# Patient Record
Sex: Female | Born: 2000 | Race: Black or African American | Hispanic: No | Marital: Single | Smoking: Never smoker
Health system: Southern US, Community
[De-identification: ages and names within clinical notes are randomized; demographics above are authoritative.]

## PROBLEM LIST (undated history)

## (undated) DIAGNOSIS — J45909 Unspecified asthma, uncomplicated: Secondary | ICD-10-CM

---
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
## 2022-01-04 ENCOUNTER — Ambulatory Visit: Payer: Medicaid Other | Admitting: Physical Therapy

## 2022-01-04 ENCOUNTER — Ambulatory Visit (INDEPENDENT_AMBULATORY_CARE_PROVIDER_SITE_OTHER): Payer: Medicaid Other | Admitting: Internal Medicine

## 2022-01-04 ENCOUNTER — Encounter: Payer: Self-pay | Admitting: Internal Medicine

## 2022-01-04 VITALS — BP 126/68 | HR 56 | Temp 98.0°F | Resp 16 | Ht 66.0 in | Wt 263.1 lb

## 2022-01-04 DIAGNOSIS — M25562 Pain in left knee: Secondary | ICD-10-CM | POA: Diagnosis not present

## 2022-01-04 DIAGNOSIS — M25462 Effusion, left knee: Secondary | ICD-10-CM

## 2022-01-04 NOTE — Progress Notes (Signed)
Established Patient Office Visit  Subjective:  Patient ID: Savannah Holt, female    DOB: 2001-11-05  Age: 21 y.o. MRN: [id number]  CC:  Chief Complaint  Patient presents with   Follow-up   Knee Pain    Bilateral,  having pain, hard to walk, swelling, Dr. Ancil Boozer sent her to PT but it has been going on for a month and she needs to know whats going on    HPI Savannah Holt presents for follow up on bilateral knee swelling. Was seen about 3 weeks ago for same issue ans was started on mobic 15 mg as needed for pain. Pain started in 1.5 months ago, started in right knee and then in left knee. Both swollen. Mainly left knee currently. Denies initial trauma but does stand and walk frequently at job.  KNEE PAIN Duration: 6 weeks Involved knee:  first right knee, now left Mechanism of injury: unknown, no known trauma Location:diffuse Onset: sudden Severity:  at rest mild but with getting more moderate with standing up and down   Quality:  aching Frequency: intermittent Radiation: no Aggravating factors: weight bearing and walking  Alleviating factors: NSAIDs and rest  Went to physical therapy once last week, not sure if it helped Status: worse Treatments attempted: rest, ibuprofen, and physical therapy  Relief with NSAIDs?:  mild Weakness with weight bearing or walking: no Sensation of giving way: no Locking: no Popping: no Bruising: no Swelling: yes Redness: no Paresthesias/decreased sensation: no Fevers: no No other joints bothering her, no urinary symptoms, not sexually active. No family history of inflammatory or auto-immune disorders that she knows of.   Past Medical History:  Diagnosis Date   Asthma     No past surgical history on file.  Family History  Problem Relation Age of Onset   Heart failure Mother    Seizures Father    Asthma Sister     Social History   Socioeconomic History   Marital status: Single    Spouse name: Not on file   Number of  children: Not on file   Years of education: Not on file   Highest education level: Not on file  Occupational History   Not on file  Tobacco Use   Smoking status: Never   Smokeless tobacco: Never  Vaping Use   Vaping Use: Never used  Substance and Sexual Activity   Alcohol use: Never   Drug use: Never   Sexual activity: Never  Other Topics Concern   Not on file  Social History Narrative   Not on file   Social Determinants of Health   Financial Resource Strain: Not on file  Food Insecurity: Not on file  Transportation Needs: Not on file  Physical Activity: Not on file  Stress: Not on file  Social Connections: Not on file  Intimate Partner Violence: Not on file    Outpatient Medications Prior to Visit  Medication Sig Dispense Refill   albuterol (VENTOLIN HFA) 108 (90 Base) MCG/ACT inhaler Inhale 2 puffs into the lungs every 6 (six) hours as needed for wheezing or shortness of breath. 18 g 1   cetirizine (ZYRTEC ALLERGY) 10 MG tablet Take 1 tablet (10 mg total) by mouth daily. 30 tablet 2   meloxicam (MOBIC) 15 MG tablet Take 1 tablet (15 mg total) by mouth daily. With food 30 tablet 0   montelukast (SINGULAIR) 10 MG tablet Take 1 tablet (10 mg total) by mouth at bedtime. 30 tablet 5   No facility-administered  medications prior to visit.    No Known Allergies  ROS Review of Systems  Constitutional:  Negative for chills and fever.  Genitourinary:  Negative for dysuria and hematuria.  Musculoskeletal:  Positive for arthralgias and joint swelling.  Skin: Negative.      Objective:    Physical Exam Constitutional:      Appearance: Normal appearance.  HENT:     Head: Normocephalic and atraumatic.  Eyes:     Conjunctiva/sclera: Conjunctivae normal.  Cardiovascular:     Rate and Rhythm: Normal rate and regular rhythm.  Pulmonary:     Effort: Pulmonary effort is normal.     Breath sounds: Normal breath sounds.  Musculoskeletal:        General: Swelling present. No  tenderness. Normal range of motion.     Right knee: Normal. No swelling, erythema, ecchymosis, bony tenderness or crepitus. Normal range of motion. No tenderness. No LCL laxity, MCL laxity, ACL laxity or PCL laxity. Normal alignment, normal meniscus and normal patellar mobility.     Instability Tests: Anterior drawer test negative. Posterior drawer test negative. Anterior Lachman test negative. Medial McMurray test negative and lateral McMurray test negative.     Left knee: Swelling present. No erythema, ecchymosis, bony tenderness or crepitus. Normal range of motion. No tenderness. No LCL laxity, MCL laxity, ACL laxity or PCL laxity.Normal alignment, normal meniscus and normal patellar mobility.     Instability Tests: Anterior drawer test negative. Posterior drawer test negative. Anterior Lachman test negative. Medial McMurray test negative and lateral McMurray test negative.     Right lower leg: No edema.     Left lower leg: No edema.  Skin:    General: Skin is warm and dry.  Neurological:     General: No focal deficit present.     Mental Status: She is alert. Mental status is at baseline.  Psychiatric:        Behavior: Behavior normal.     Comments: Tearful.    BP 126/68    Pulse (!) 56    Temp 98 F (36.7 C)    Resp 16    Ht _0  (1.676 m)    Wt 263 lb 1.6 oz (119.3 kg)    LMP 12/02/2021    SpO2 99%    BMI 42.47 kg/m  Wt Readings from Last 3 Encounters:  12/21/21 260 lb (117.9 kg) (>99 %, Z= 2.54)*  09/11/20 (!) 266 lb (120.7 kg) (>99 %, Z= 2.55)*  12/24/19 297 lb 12.8 oz (135.1 kg) (>99 %, Z= 2.74)*   * Growth percentiles are based on CDC (Girls, 2-20 Years) data.     Health Maintenance Due  Topic Date Due   HPV VACCINES (1 - 2-dose series) Never done   HIV Screening  Never done   INFLUENZA VACCINE  Never done       Topic Date Due   HPV VACCINES (1 - 2-dose series) Never done    No results found for: TSH Lab Results  Component Value Date   WBC 4.4 (L) 12/21/2021    HGB 13.7 12/21/2021   HCT 42.1 12/21/2021   MCV 81.3 12/21/2021   PLT 247 12/21/2021   Lab Results  Component Value Date   NA 139 12/21/2021   K 4.5 12/21/2021   CO2 25 12/21/2021   GLUCOSE 82 12/21/2021   BUN 10 12/21/2021   CREATININE 0.72 12/21/2021   BILITOT 0.3 12/21/2021   ALKPHOS 72 03/24/2019   AST 15 12/21/2021   ALT  22 12/21/2021   PROT 6.9 12/21/2021   ALBUMIN 4.1 03/24/2019   CALCIUM 9.3 12/21/2021   ANIONGAP 9 03/24/2019   EGFR 124 12/21/2021   Lab Results  Component Value Date   CHOL 151 12/21/2021   Lab Results  Component Value Date   HDL 52 12/21/2021   Lab Results  Component Value Date   LDLCALC 85 12/21/2021   Lab Results  Component Value Date   TRIG 55 12/21/2021   Lab Results  Component Value Date   CHOLHDL 2.9 12/21/2021   Lab Results  Component Value Date   HGBA1C 5.2 12/21/2021      Assessment & Plan:   1. Pain and swelling of left knee: Physical exam with normal ROM, no pain to palpation but mild medial swelling on the left. Has tried Mobic daily for 1 week and 1 session of physical therapy. Very frustrated about why knee still swollen, concerned about her job. An x-ray will be ordered to assess effusion. Blood work to assess for inflammation ordered as well. Not likely infectious as she is not sexually active. No history of trauma. Discussed rest, elevation ice and anti-inflammatories as needed to reduce swelling. Work note given. Follow up in 2 weeks  - Sed Rate (ESR) - C-reactive protein - Antinuclear Antib (ANA) - DG Knee 3 Views Left; Future   Follow-up: Return in about 2 weeks (around 01/18/2022).    Teodora Medici, DO

## 2022-01-04 NOTE — Patient Instructions (Signed)
It was great seeing you today!  Plan discussed at today's visit: -Blood work ordered today, results will be uploaded to [facility].  -Can use rest, ice, brace, elevation and Mobic (take with food, do NOT take any other anti-inflammatories) -Can use Voltaren  -X-ray of left knee today  Follow up in: 2 weeks  Take care and let us know if you have any questions or concerns prior to your next visit.  Dr. Rosana Berger  Knee Effusion Knee effusion refers to excess fluid in the knee joint. This can cause pain and swelling in your knee. Knee effusion creates more pressure than usual in your knee joint. This makes it more difficult for you to bend and move your knee. If there is fluid in your knee, it often means that something is wrong inside your knee. This can be a result of: Severe arthritis. Injury to the knee muscles or to tissues in the knee (ligaments or cartilage). Infection. Autoimmune disease. This means that your body's defense system (immune system) mistakenly attacks healthy body tissues. Follow these instructions at home: If you have a brace or an immobilizer: Wear it as told by your health care provider. Remove it only as told by your health care provider. Check the skin around it every day. Tell your health care provider about any concerns. Loosen it if your toes tingle, become numb, or turn cold and blue. Keep it clean. If the brace or immobilizer is not waterproof: Do not let it get wet. Cover it with a watertight covering when you take a bath or shower. Managing pain, stiffness, and swelling  If directed, put ice on the affected area. To do this: If you have a removable brace or immobilizer, remove it as told by your health care provider. Put ice in a plastic bag. Place a towel between your skin and the bag. Leave the ice on for 20 minutes, 2-3 times a day. Remove the ice if your skin turns bright red. This is very important. If you cannot feel pain, heat, or cold, you have a  greater risk of damage to the area. Move your toes often to reduce stiffness and swelling. Raise (elevate) your knee above the level of your heart while you are sitting or lying down. Activity Rest as told by your health care provider. Do not use the injured limb to support your body weight until your health care provider says that you can. Use crutches as told by your health care provider. Do exercises as told by your health care provider. General instructions Take over-the-counter and prescription medicines only as told by your health care provider. Do not use any products that contain nicotine or tobacco. These products include cigarettes, chewing tobacco, and vaping devices, such as e-cigarettes. If you need help quitting, ask your health care provider. Wear an elastic bandage or a wrap that puts pressure on your knee (compression wrap) as told by your health care provider. Keep all follow-up visits. This is important. Contact a health care provider if: You continue to have pain in your knee. You have a fever or chills. Get help right away if: You have swelling or redness in your knee that gets worse or does not get better. You have severe pain in your knee. Summary Knee effusion refers to excess fluid in the knee joint. This causes pain and swelling and makes it difficult to bend and move your knee. Effusion may be caused by severe arthritis, autoimmune disease, infection, or injury to the knee muscles  or to tissues in the knee (ligaments or cartilage). Take over-the-counter and prescription medicines only as told by your health care provider. If you have a brace or an immobilizer, wear it as told by your health care provider. This information is not intended to replace advice given to you by your health care provider. Make sure you discuss any questions you have with your health care provider. Document Revised: 08/12/2020 Document Reviewed: 08/12/2020 Elsevier Patient Education  2022  [employer].

## 2022-01-05 ENCOUNTER — Telehealth: Payer: Self-pay

## 2022-01-05 ENCOUNTER — Telehealth: Payer: Self-pay | Admitting: Physical Therapy

## 2022-01-05 ENCOUNTER — Ambulatory Visit: Payer: Medicaid Other | Admitting: Physical Therapy

## 2022-01-05 NOTE — Telephone Encounter (Signed)
Called pt to inquiry about absence. Pt stated she had a doctor's apt and she forgot to call back to cancel PT apt. She reports she will be able to attend next apt.

## 2022-01-05 NOTE — Telephone Encounter (Signed)
Copied from CRM [id number]. Topic: General - Other >> Jan 05, 2022  2:43 PM Wyonia Hough E wrote: Reason for CRM: Dr. Caralee Ates gave pt a note for employer/(pt was referred to PT and advised to stay off legs and sit/ they emailed this to her employer)/ the note from Dr. Caralee Ates states to limit pts walking and standing and allow pt to rest / pt states her employer needs clarifications/ pt asked if Dr. Caralee Ates can complete another letter stating that the pt needs to be sitting while at work most of her shift / pt stated she can only move around or stand for about 2 hrs out of her 8 hr shift/ please advise

## 2022-01-06 ENCOUNTER — Ambulatory Visit: Payer: Self-pay | Admitting: *Deleted

## 2022-01-06 LAB — ANA: Anti Nuclear Antibody (ANA): NEGATIVE

## 2022-01-06 LAB — C-REACTIVE PROTEIN: CRP: 1.5 mg/L (ref <8.0)

## 2022-01-06 LAB — SEDIMENTATION RATE: Sed Rate: 6 mm/h (ref 0–20)

## 2022-01-06 NOTE — Telephone Encounter (Signed)
Referred patient to [facility] Chief Complaint: bilateral knee pain and swelling Symptoms: same as above Frequency: began about 6 weeks ago Pertinent Negatives: Patient denies fever Disposition: [] ED /[] Urgent Care (no appt availability in office) / [] Appointment(In office/virtual)/ []  [facility]/ [] Home Care/ [] Refused Recommended Disposition /[] [facility]/ [x]  Follow-up with PCP Additional Notes: Patient has been seen by pcp and other staff provider recently. She has started PT but continues to have the knee pain which is worsened by standing and walking at her job. Takes ibuprofen and ices often. She has requested additional note for her employer stating she is having difficulty with both knees and is not able to tolerate standing at this time. PT provided a note to employer but the employer is not satisfied with note from PT.      Reason for Disposition  [1] MODERATE pain (e.g., interferes with normal activities, limping) AND [2] present > 3 days  Answer Assessment - Initial Assessment Questions 1. LOCATION and RADIATION: "Where is the pain located?"      Both knees 2. QUALITY: "What does the pain feel like?"  (e.g., sharp, dull, aching, burning)     Ache with popping and cracking  3. SEVERITY: "How bad is the pain?" "What does it keep you from doing?"   (Scale 1-10; or mild, moderate, severe)   -  MILD (1-3): doesn't interfere with normal activities    -  MODERATE (4-7): interferes with normal activities (e.g., work or school) or awakens from sleep, limping    -  SEVERE (8-10): excruciating pain, unable to do any normal activities, unable to walk     After one-two hours gets more painful 4. ONSET: "When did the pain start?" "Does it come and go, or is it there all the time?"     about 6 weeks ago 5. RECURRENT: "Have you had this pain before?" If Yes, ask: "When, and what happened then?"     na 6. SETTING: "Has there been any recent work, exercise  or other activity that involved that part of the body?"      *No Answer* 7. AGGRAVATING FACTORS: "What makes the knee pain worse?" (e.g., walking, climbing stairs, running)     na 8. ASSOCIATED SYMPTOMS: "Is there any swelling or redness of the knee?"     Mild swelling 9. OTHER SYMPTOMS: "Do you have any other symptoms?" (e.g., chest pain, difficulty breathing, fever, calf pain)     None reported 10. PREGNANCY: "Is there any chance you are pregnant?" "When was your last menstrual period?"       no  Protocols used: Knee Pain-A-AH

## 2022-01-06 NOTE — Telephone Encounter (Signed)
Faxed, tried to call pt no answer and no vm setup

## 2022-01-06 NOTE — Telephone Encounter (Addendum)
Patient following up on Dr. Note stating time is sensitive and would like Dr. Caralee Ates to please fax Dr. Note with specific restrictions mentioned below, please fax Attention to: [facility] fax #  [phone]

## 2022-01-06 NOTE — Telephone Encounter (Signed)
Tried to call pt no vm set up  

## 2022-01-10 ENCOUNTER — Ambulatory Visit: Payer: Medicaid Other | Admitting: Physical Therapy

## 2022-01-11 ENCOUNTER — Ambulatory Visit: Payer: Medicaid Other | Admitting: Physical Therapy

## 2022-01-12 ENCOUNTER — Ambulatory Visit: Payer: Medicaid Other | Admitting: Physical Therapy

## 2022-01-14 ENCOUNTER — Ambulatory Visit: Payer: Medicaid Other | Admitting: Physical Therapy

## 2022-01-17 ENCOUNTER — Ambulatory Visit: Payer: Medicaid Other | Admitting: Physical Therapy

## 2022-01-18 ENCOUNTER — Encounter: Payer: Medicaid Other | Admitting: Physical Therapy

## 2022-01-20 ENCOUNTER — Encounter: Payer: Medicaid Other | Admitting: Physical Therapy

## 2022-01-21 ENCOUNTER — Ambulatory Visit: Payer: Medicaid Other | Admitting: Internal Medicine

## 2022-01-21 NOTE — Progress Notes (Deleted)
Established Patient Office Visit  Subjective:  Patient ID: Savannah Holt, female    DOB: 2001/09/04  Age: 21 y.o. MRN: [id number]  CC: No chief complaint on file.   HPI Savannah Holt presents for follow up on bilateral knee swelling. She was seen 2 weeks ago for the same issue. At that time her left knee was more swollen. She was started on Mobic, which she states does not help. Inflammatory work up negative, ANA negative. No other laboratory abnormalities. X-ray of the left knee ordered but not yet obtained.    Past Medical History:  Diagnosis Date   Asthma     No past surgical history on file.  Family History  Problem Relation Age of Onset   Heart failure Mother    Seizures Father    Asthma Sister     Social History   Socioeconomic History   Marital status: Single    Spouse name: Not on file   Number of children: Not on file   Years of education: Not on file   Highest education level: Not on file  Occupational History   Not on file  Tobacco Use   Smoking status: Never   Smokeless tobacco: Never  Vaping Use   Vaping Use: Never used  Substance and Sexual Activity   Alcohol use: Never   Drug use: Never   Sexual activity: Never  Other Topics Concern   Not on file  Social History Narrative   Not on file   Social Determinants of Health   Financial Resource Strain: Not on file  Food Insecurity: Not on file  Transportation Needs: Not on file  Physical Activity: Not on file  Stress: Not on file  Social Connections: Not on file  Intimate Partner Violence: Not on file    Outpatient Medications Prior to Visit  Medication Sig Dispense Refill   albuterol (VENTOLIN HFA) 108 (90 Base) MCG/ACT inhaler Inhale 2 puffs into the lungs every 6 (six) hours as needed for wheezing or shortness of breath. 18 g 1   cetirizine (ZYRTEC ALLERGY) 10 MG tablet Take 1 tablet (10 mg total) by mouth daily. 30 tablet 2   meloxicam (MOBIC) 15 MG tablet Take 1 tablet (15 mg total)  by mouth daily. With food (Patient not taking: Reported on 01/04/2022) 30 tablet 0   montelukast (SINGULAIR) 10 MG tablet Take 1 tablet (10 mg total) by mouth at bedtime. 30 tablet 5   No facility-administered medications prior to visit.    No Known Allergies  ROS Review of Systems    Objective:    Physical Exam  There were no vitals taken for this visit. Wt Readings from Last 3 Encounters:  01/04/22 263 lb 1.6 oz (119.3 kg) (>99 %, Z= 2.56)*  12/21/21 260 lb (117.9 kg) (>99 %, Z= 2.54)*  09/11/20 (!) 266 lb (120.7 kg) (>99 %, Z= 2.55)*   * Growth percentiles are based on CDC (Girls, 2-20 Years) data.     Health Maintenance Due  Topic Date Due   COVID-19 Vaccine (1) Never done   HPV VACCINES (1 - 2-dose series) Never done   HIV Screening  Never done   INFLUENZA VACCINE  Never done       Topic Date Due   HPV VACCINES (1 - 2-dose series) Never done    No results found for: TSH Lab Results  Component Value Date   WBC 4.4 (L) 12/21/2021   HGB 13.7 12/21/2021   HCT 42.1 12/21/2021  MCV 81.3 12/21/2021   PLT 247 12/21/2021   Lab Results  Component Value Date   NA 139 12/21/2021   K 4.5 12/21/2021   CO2 25 12/21/2021   GLUCOSE 82 12/21/2021   BUN 10 12/21/2021   CREATININE 0.72 12/21/2021   BILITOT 0.3 12/21/2021   ALKPHOS 72 03/24/2019   AST 15 12/21/2021   ALT 22 12/21/2021   PROT 6.9 12/21/2021   ALBUMIN 4.1 03/24/2019   CALCIUM 9.3 12/21/2021   ANIONGAP 9 03/24/2019   EGFR 124 12/21/2021   Lab Results  Component Value Date   CHOL 151 12/21/2021   Lab Results  Component Value Date   HDL 52 12/21/2021   Lab Results  Component Value Date   LDLCALC 85 12/21/2021   Lab Results  Component Value Date   TRIG 55 12/21/2021   Lab Results  Component Value Date   CHOLHDL 2.9 12/21/2021   Lab Results  Component Value Date   HGBA1C 5.2 12/21/2021      Assessment & Plan:   Problem List Items Addressed This Visit   None   No orders of the  defined types were placed in this encounter.   Follow-up: No follow-ups on file.    Teodora Medici, DO

## 2022-01-24 ENCOUNTER — Ambulatory Visit: Payer: Medicaid Other | Admitting: Physical Therapy

## 2022-01-25 ENCOUNTER — Encounter: Payer: Medicaid Other | Admitting: Physical Therapy

## 2022-01-27 ENCOUNTER — Encounter: Payer: Medicaid Other | Admitting: Physical Therapy

## 2022-01-31 NOTE — Progress Notes (Deleted)
Name: Savannah Holt   MRN: [id number]    DOB: [birth year]   Date:01/31/2022       Progress Note  Subjective  Chief Complaint  Follow Up- Knee Pain  HPI  *** Patient Active Problem List   Diagnosis Date Noted   Allergic rhinitis 09/04/2019   Mild intermittent asthma 09/04/2019   BMI (body mass index), pediatric, greater than or equal to 95% for age 19/28/2017   Ovarian cyst 08/13/2013    No past surgical history on file.  Family History  Problem Relation Age of Onset   Heart failure Mother    Seizures Father    Asthma Sister     Social History   Tobacco Use   Smoking status: Never   Smokeless tobacco: Never  Substance Use Topics   Alcohol use: Never     Current Outpatient Medications:    albuterol (VENTOLIN HFA) 108 (90 Base) MCG/ACT inhaler, Inhale 2 puffs into the lungs every 6 (six) hours as needed for wheezing or shortness of breath., Disp: 18 g, Rfl: 1   cetirizine (ZYRTEC ALLERGY) 10 MG tablet, Take 1 tablet (10 mg total) by mouth daily., Disp: 30 tablet, Rfl: 2   meloxicam (MOBIC) 15 MG tablet, Take 1 tablet (15 mg total) by mouth daily. With food (Patient not taking: Reported on 01/04/2022), Disp: 30 tablet, Rfl: 0   montelukast (SINGULAIR) 10 MG tablet, Take 1 tablet (10 mg total) by mouth at bedtime., Disp: 30 tablet, Rfl: 5  No Known Allergies  I personally reviewed active problem list, medication list, allergies, family history, social history, health maintenance with the patient/caregiver today.   ROS  ***  Objective  There were no vitals filed for this visit.  There is no height or weight on file to calculate BMI.  Physical Exam ***  Recent Results (from the past 2160 hour(s))  Cervicovaginal ancillary only     Status: None   Collection Time: 12/21/21 11:24 AM  Result Value Ref Range   Neisseria Gonorrhea Negative    Chlamydia Negative    Comment Normal Reference Ranger Chlamydia - Negative    Comment      Normal Reference Range  Neisseria Gonorrhea - Negative  Lipid panel     Status: None   Collection Time: 12/21/21 11:49 AM  Result Value Ref Range   Cholesterol 151 <170 mg/dL   HDL 52 >45 mg/dL   Triglycerides 55 <90 mg/dL   LDL Cholesterol (Calc) 85 <110 mg/dL (calc)    Comment: LDL-C is now calculated using the Martin-Hopkins  calculation, which is a validated novel method providing  better accuracy than the Friedewald equation in the  estimation of LDL-C.  Cresenciano Genre et al. Annamaria Helling. 3893;734(28): 2061-2068  (http://education.[facility].com/faq/FAQ164)    Total CHOL/HDL Ratio 2.9 <5.0 (calc)   Non-HDL Cholesterol (Calc) 99 <120 mg/dL (calc)    Comment: For patients with diabetes plus 1 major ASCVD risk  factor, treating to a non-HDL-C goal of <100 mg/dL  (LDL-C of <70 mg/dL) is considered a therapeutic  option.   CBC with Differential/Platelet     Status: Abnormal   Collection Time: 12/21/21 11:49 AM  Result Value Ref Range   WBC 4.4 (L) 4.5 - 13.0 Thousand/uL   RBC 5.18 (H) 3.80 - 5.10 Million/uL   Hemoglobin 13.7 11.5 - 15.3 g/dL   HCT 42.1 34.0 - 46.0 %   MCV 81.3 78.0 - 98.0 fL   MCH 26.4 25.0 - 35.0 pg   MCHC 32.5 31.0 -  36.0 g/dL   RDW 15.1 (H) 11.0 - 15.0 %   Platelets 247 140 - 400 Thousand/uL   MPV 12.1 7.5 - 12.5 fL   Neutro Abs 1,461 (L) 1,800 - 8,000 cells/uL   Lymphs Abs 2,341 1,200 - 5,200 cells/uL   Absolute Monocytes 409 200 - 900 cells/uL   Eosinophils Absolute 150 15 - 500 cells/uL   Basophils Absolute 40 0 - 200 cells/uL   Neutrophils Relative % 33.2 %   Total Lymphocyte 53.2 %   Monocytes Relative 9.3 %   Eosinophils Relative 3.4 %   Basophils Relative 0.9 %  COMPLETE METABOLIC PANEL WITH GFR     Status: None   Collection Time: 12/21/21 11:49 AM  Result Value Ref Range   Glucose, Bld 82 65 - 139 mg/dL    Comment: .        Non-fasting reference interval .    BUN 10 7 - 20 mg/dL   Creat 0.72 0.50 - 0.96 mg/dL   eGFR 124 > OR = 60 mL/min/1.47m    Comment: The  eGFR is based on the CKD-EPI 2021 equation. To calculate  the new eGFR from a previous Creatinine or Cystatin C result, go to https://www.kidney.org/professionals/ kdoqi/gfr%5Fcalculator    BUN/Creatinine Ratio NOT APPLICABLE 6 - 22 (calc)   Sodium 139 135 - 146 mmol/L   Potassium 4.5 3.8 - 5.1 mmol/L   Chloride 107 98 - 110 mmol/L   CO2 25 20 - 32 mmol/L   Calcium 9.3 8.9 - 10.4 mg/dL   Total Protein 6.9 6.3 - 8.2 g/dL   Albumin 4.1 3.6 - 5.1 g/dL   Globulin 2.8 2.0 - 3.8 g/dL (calc)   AG Ratio 1.5 1.0 - 2.5 (calc)   Total Bilirubin 0.3 0.2 - 1.1 mg/dL   Alkaline phosphatase (APISO) 63 36 - 128 U/L   AST 15 12 - 32 U/L   ALT 22 5 - 32 U/L  Hemoglobin A1c     Status: None   Collection Time: 12/21/21 11:49 AM  Result Value Ref Range   Hgb A1c MFr Bld 5.2 <5.7 % of total Hgb    Comment: For the purpose of screening for the presence of diabetes: . <5.7%       Consistent with the absence of diabetes 5.7-6.4%    Consistent with increased risk for diabetes             (prediabetes) > or =6.5%  Consistent with diabetes . This assay result is consistent with a decreased risk of diabetes. . Currently, no consensus exists regarding use of hemoglobin A1c for diagnosis of diabetes in children. . According to American Diabetes Association (ADA) guidelines, hemoglobin A1c <7.0% represents optimal control in non-pregnant diabetic patients. Different metrics may apply to specific patient populations.  Standards of Medical Care in Diabetes(ADA). .    Mean Plasma Glucose 103 mg/dL   eAG (mmol/L) 5.7 mmol/L  Hepatitis C antibody     Status: None   Collection Time: 12/21/21 11:49 AM  Result Value Ref Range   Hepatitis C Ab NON-REACTIVE NON-REACTIVE   SIGNAL TO CUT-OFF 0.05 <1.00    Comment: . HCV antibody was non-reactive. There is no laboratory  evidence of HCV infection. . In most cases, no further action is required. However, if recent HCV exposure is suspected, a test for HCV  RNA (test code [phone] is suggested. . For additional information please refer to http://education.[facility].com/faq/FAQ22v1 (This link is being provided for informational/ educational purposes only.) .  Sed Rate (ESR)     Status: None   Collection Time: 01/04/22  3:39 PM  Result Value Ref Range   Sed Rate 6 0 - 20 mm/h  C-reactive protein     Status: None   Collection Time: 01/04/22  3:39 PM  Result Value Ref Range   CRP 1.5 <8.0 mg/L  Antinuclear Antib (ANA)     Status: None   Collection Time: 01/04/22  3:39 PM  Result Value Ref Range   Anti Nuclear Antibody (ANA) NEGATIVE NEGATIVE    Comment: ANA IFA is a first line screen for detecting the presence of up to approximately 150 autoantibodies in various autoimmune diseases. A negative ANA IFA result suggests an ANA-associated autoimmune disease is not present at this time, but is not definitive. If there is high clinical suspicion for Sjogren's syndrome, testing for anti-SS-A/Ro antibody should be considered. Anti-Jo-1 antibody should be considered for clinically suspected inflammatory myopathies. . AC-0: Negative . International Consensus on ANA Patterns (https://www.hernandez-brewer.com/) . For additional information, please refer to http://education.[facility].com/faq/FAQ177 (This link is being provided for informational/ educational purposes only.) .     PHQ2/9: Depression screen [facility] 2/9 01/04/2022 12/21/2021 11/26/2019 10/30/2019 09/03/2019  Decreased Interest 0 0 0 0 0  Down, Depressed, Hopeless 0 0 0 0 0  PHQ - 2 Score 0 0 0 0 0  Altered sleeping 0 0 0 0 -  Tired, decreased energy 0 0 0 0 -  Change in appetite 0 0 0 0 -  Feeling bad or failure about yourself  0 0 0 0 -  Trouble concentrating 0 0 0 0 -  Moving slowly or fidgety/restless 0 0 0 0 -  Suicidal thoughts 0 0 0 0 -  PHQ-9 Score 0 0 0 0 -  Difficult doing work/chores Not difficult at all - Not difficult at all Not difficult at all  -    phq 9 is {gen pos [id number]}   Fall Risk: Fall Risk  01/04/2022 12/21/2021 11/26/2019 10/30/2019 09/03/2019  Falls in the past year? 0 0 1 0 0  Number falls in past yr: 0 0 1 0 0  Injury with Fall? 0 0 0 0 0  Risk for fall due to : - No Fall Risks - - -  Follow up - Falls prevention discussed - Falls evaluation completed -      Functional Status Survey:      Assessment & Plan  *** There are no diagnoses linked to this encounter.

## 2022-02-01 ENCOUNTER — Ambulatory Visit: Payer: Medicaid Other | Admitting: Family Medicine

## 2022-02-01 ENCOUNTER — Encounter: Payer: Medicaid Other | Admitting: Physical Therapy

## 2022-02-02 ENCOUNTER — Encounter: Payer: Self-pay | Admitting: Family Medicine

## 2023-09-14 ENCOUNTER — Ambulatory Visit: Payer: Medicaid Other

## 2024-09-01 ENCOUNTER — Encounter: Payer: Self-pay | Admitting: Emergency Medicine

## 2024-09-01 ENCOUNTER — Emergency Department
Admission: EM | Admit: 2024-09-01 | Discharge: 2024-09-02 | Disposition: A | Discharge status: Home / Self Care | Attending: Emergency Medicine | Admitting: Emergency Medicine

## 2024-09-01 ENCOUNTER — Other Ambulatory Visit: Payer: Self-pay

## 2024-09-01 DIAGNOSIS — J069 Acute upper respiratory infection, unspecified: Secondary | ICD-10-CM | POA: Insufficient documentation

## 2024-09-01 DIAGNOSIS — J45909 Unspecified asthma, uncomplicated: Secondary | ICD-10-CM | POA: Insufficient documentation

## 2024-09-01 DIAGNOSIS — B349 Viral infection, unspecified: Secondary | ICD-10-CM | POA: Insufficient documentation

## 2024-09-01 NOTE — ED Triage Notes (Signed)
 Patient reports cough, congestion, sore throat, and chills since Friday. Sibling with similar symptoms.

## 2024-09-02 LAB — RESP PANEL BY RT-PCR (RSV, FLU A&B, COVID)  RVPGX2
Influenza A by PCR: NEGATIVE
Influenza B by PCR: NEGATIVE
Resp Syncytial Virus by PCR: NEGATIVE
SARS Coronavirus 2 by RT PCR: NEGATIVE

## 2024-09-02 LAB — GROUP A STREP BY PCR: Group A Strep by PCR: NOT DETECTED

## 2024-09-02 MED ORDER — GUAIFENESIN-CODEINE 100-10 MG/5ML PO SOLN: 5.0000 mL | Freq: Four times a day (QID) | ORAL | 0 refills | Status: AC | PRN

## 2024-09-02 MED ORDER — ONDANSETRON 4 MG PO TBDP: 4.0000 mg | ORAL_TABLET | Freq: Three times a day (TID) | ORAL | 0 refills | Status: AC | PRN

## 2024-09-02 NOTE — ED Provider Notes (Signed)
   [facility] Provider Note    Event Date/Time   First MD Initiated Contact with Patient 09/01/24 2339     (approximate)  History   Chief Complaint: URI  HPI  Savannah Holt is a 23 y.o. female with a past medical history of asthma, presents to the emergency department for cough congestion and nausea.  According to the patient 2 days ago she developed cough and congestion.  Patient is here with a family member with similar symptoms that started several days before hers.  Patient denies any abdominal pain or chest pain.  No known fever.  States nausea but no vomiting.  Does state a sore throat and cough.  Physical Exam   Triage Vital Signs: ED Triage Vitals  Encounter Vitals Group     BP 09/01/24 2307 118/77     Girls Systolic BP Percentile --      Girls Diastolic BP Percentile --      Boys Systolic BP Percentile --      Boys Diastolic BP Percentile --      Pulse Rate 09/01/24 2307 79     Resp 09/01/24 2307 20     Temp 09/01/24 2307 98.9 F (37.2 C)     Temp Source 09/01/24 2307 Oral     SpO2 09/01/24 2307 98 %     Weight 09/01/24 2306 240 lb (108.9 kg)     Height 09/01/24 2306 5' 6 (1.676 m)     Head Circumference --      Peak Flow --      Pain Score 09/01/24 2306 10     Pain Loc --      Pain Education --      Exclude from Growth Chart --     Most recent vital signs: Vitals:   09/01/24 2307  BP: 118/77  Pulse: 79  Resp: 20  Temp: 98.9 F (37.2 C)  SpO2: 98%    General: Awake, no distress.  CV:  Good peripheral perfusion.  Regular rate and rhythm  Resp:  Normal effort.  Equal breath sounds bilaterally.  No wheeze rales or rhonchi. Abd:  No distention.  Soft, nontender.   ED Results / Procedures / Treatments   MEDICATIONS ORDERED IN ED: Medications - No data to display   IMPRESSION / MDM / ASSESSMENT AND PLAN / ED COURSE  I reviewed the triage vital signs and the nursing notes.  Patient's presentation is most consistent with  acute presentation with potential threat to life or bodily function.  Patient presents the emergency department for cough congestion and nausea starting 2 days ago.  Family members here with the patient with similar symptoms as well.  Highly suspect viral syndrome, upper respiratory infection.  Will check a respiratory panel and continue to closely monitor.  Patient agreeable to plan.  Respiratory panel negative.  Will discharge with Zofran  and a cough medication.  FINAL CLINICAL IMPRESSION(S) / ED DIAGNOSES   Upper respiratory infection Viral illness   Note:  This document was prepared using Dragon voice recognition software and may include unintentional dictation errors.   Dorothyann Drivers, MD 09/02/24 [phone]

## 2025-01-06 ENCOUNTER — Emergency Department
Admission: EM | Admit: 2025-01-06 | Discharge: 2025-01-06 | Disposition: A | Discharge status: Home / Self Care | Attending: Emergency Medicine | Admitting: Emergency Medicine

## 2025-01-06 ENCOUNTER — Emergency Department

## 2025-01-06 ENCOUNTER — Other Ambulatory Visit: Payer: Self-pay

## 2025-01-06 DIAGNOSIS — J101 Influenza due to other identified influenza virus with other respiratory manifestations: Secondary | ICD-10-CM | POA: Diagnosis not present

## 2025-01-06 DIAGNOSIS — R059 Cough, unspecified: Secondary | ICD-10-CM | POA: Diagnosis present

## 2025-01-06 DIAGNOSIS — J45909 Unspecified asthma, uncomplicated: Secondary | ICD-10-CM | POA: Diagnosis not present

## 2025-01-06 LAB — RESP PANEL BY RT-PCR (RSV, FLU A&B, COVID)  RVPGX2
Influenza A by PCR: POSITIVE — AB
Influenza B by PCR: NEGATIVE
Resp Syncytial Virus by PCR: NEGATIVE
SARS Coronavirus 2 by RT PCR: NEGATIVE

## 2025-01-06 LAB — GROUP A STREP BY PCR: Group A Strep by PCR: NOT DETECTED

## 2025-01-06 MED ORDER — ACETAMINOPHEN 325 MG PO TABS
650.0000 mg | ORAL_TABLET | Freq: Once | ORAL | Status: AC | PRN
  Administered 2025-01-06: 650 mg via ORAL
  Filled 2025-01-06: qty 2

## 2025-01-06 MED ORDER — GUAIFENESIN ER 600 MG PO TB12: 600.0000 mg | ORAL_TABLET | Freq: Two times a day (BID) | ORAL | 0 refills | Status: AC

## 2025-01-06 MED ORDER — IPRATROPIUM-ALBUTEROL 0.5-2.5 (3) MG/3ML IN SOLN
3.0000 mL | Freq: Once | RESPIRATORY_TRACT | Status: AC
  Administered 2025-01-06: 3 mL via RESPIRATORY_TRACT

## 2025-01-06 MED ORDER — ONDANSETRON 4 MG PO TBDP
4.0000 mg | ORAL_TABLET | Freq: Once | ORAL | Status: AC
  Administered 2025-01-06: 4 mg via ORAL

## 2025-01-06 MED ORDER — ACETAMINOPHEN 500 MG PO TABS: 1000.0000 mg | ORAL_TABLET | Freq: Once | ORAL | Status: DC

## 2025-01-06 MED ORDER — IBUPROFEN 800 MG PO TABS
800.0000 mg | ORAL_TABLET | Freq: Once | ORAL | Status: AC
  Administered 2025-01-06: 800 mg via ORAL
  Filled 2025-01-06: qty 1

## 2025-01-06 MED ORDER — ALBUTEROL SULFATE HFA 108 (90 BASE) MCG/ACT IN AERS: 2.0000 | INHALATION_SPRAY | Freq: Four times a day (QID) | RESPIRATORY_TRACT | 0 refills | Status: AC | PRN

## 2025-01-06 NOTE — ED Provider Notes (Signed)
 "  [facility] Provider Note    Event Date/Time   First MD Initiated Contact with Patient 01/06/25 1214     (approximate)   History   Cough   HPI  Savannah Holt is a 24 y.o. female  with a past medical history of asthma, allergic rhinitis, ovarian cyst presents to the emergency department with nausea, nasal congestion, chills, productive cough, chest discomfort only when coughing, dyspnea, sore throat, and fever that started today.  Denies abdominal pain, rash. Patient reports she has been in contact with some of her family members who have been positive for the flu.  Physical Exam   Triage Vital Signs: ED Triage Vitals  Encounter Vitals Group     BP 01/06/25 1150 126/78     Girls Systolic BP Percentile --      Girls Diastolic BP Percentile --      Boys Systolic BP Percentile --      Boys Diastolic BP Percentile --      Pulse Rate 01/06/25 1150 (!) 113     Resp 01/06/25 1150 20     Temp 01/06/25 1150 (!) 101.1 F (38.4 C)     Temp src --      SpO2 01/06/25 1150 97 %     Weight 01/06/25 1148 260 lb (117.9 kg)     Height 01/06/25 1148 5' 6 (1.676 m)     Head Circumference --      Peak Flow --      Pain Score 01/06/25 1148 4     Pain Loc --      Pain Education --      Exclude from Growth Chart --     Most recent vital signs: Vitals:   01/06/25 1342 01/06/25 1554  BP: 117/63 116/68  Pulse: (!) 104 94  Resp: 20 20  Temp:  98.9 F (37.2 C)  SpO2: 98% 98%    General: Awake, in no acute distress. Tearful on exam. Ears/Nose/Throat: TMs intact b/l. Nares patent, no nasal discharge. Oropharynx moist, mild erythema, no exudates. Dentition intact. Neck: Supple, no lymphadenopathy, no nuchal rigidity. CV: Good peripheral perfusion.  Mildly tachy, 113 bpm. Respiratory:Normal respiratory effort.  No respiratory distress. CTAB. GI: Soft, non-distended, non-tender.  Skin:Warm, dry, intact. No rashes, lesions, or ecchymosis. No cyanosis or  pallor. Neurological: A&Ox4 to person, place, time, and situation.   ED Results / Procedures / Treatments   Labs (all labs ordered are listed, but only abnormal results are displayed) Labs Reviewed  RESP PANEL BY RT-PCR (RSV, FLU A&B, COVID)  [id number] - Abnormal; Notable for the following components:      Result Value   Influenza A by PCR POSITIVE (*)    All other components within normal limits  GROUP A STREP BY PCR     EKG  NSR Rate: 73 bpm Rhythm: regular Axis: positive P Waves: present before every QRS PR Interval: 184 ms QRS Complex: 98 ms ST Segment: isometric, no elevations or depressions QT interval: 370 ms T Waves: upright, symmetric No evidence of any acute ischemic changes.  RADIOLOGY CXR FINDINGS:   LUNGS AND PLEURA: No focal pulmonary opacity. No pleural effusion. No pneumothorax.   HEART AND MEDIASTINUM: No acute abnormality of the cardiac and mediastinal silhouettes.   BONES AND SOFT TISSUES: No acute osseous abnormality.   IMPRESSION: 1. No acute process.   PROCEDURES:  Critical Care performed: No   Procedures   MEDICATIONS ORDERED IN ED: Medications  acetaminophen  (TYLENOL )  tablet 650 mg (650 mg Oral Given 01/06/25 1152)  ibuprofen  (ADVIL ) tablet 800 mg (800 mg Oral Given 01/06/25 1332)  ipratropium-albuterol  (DUONEB) 0.5-2.5 (3) MG/3ML nebulizer solution 3 mL (3 mLs Nebulization Given 01/06/25 1333)  ondansetron  (ZOFRAN -ODT) disintegrating tablet 4 mg (4 mg Oral Given 01/06/25 1333)     IMPRESSION / MDM / ASSESSMENT AND PLAN / ED COURSE  I reviewed the triage vital signs and the nursing notes.                              Differential diagnosis includes, but is not limited to, influenza A, influenza B, COVID, RSV, strep pharyngitis, viral pharyngitis, pneumonia  Patient's presentation is most consistent with acute complicated illness / injury requiring diagnostic workup.   Patient is a 24 year old female presenting with signs and  symptoms as described above.  Tearful on exam, but otherwise overall well-appearing.  Mildly febrile at 101.1.  Lungs clear to auscultation but patient reported dyspnea will do 1 DuoNeb.  Respiratory panel positive for influenza A.  Strep PCR negative.  EKG with normal sinus rhythm, no acute ischemic findings.  Chest x-ray negative for any acute findings. I independently viewed the x-ray and radiologist's report.  I agree with the radiologist's report.  Patient given Zofran , ibuprofen  and Tylenol .  Heart rate rechecked shows 94 bpm, temperature down to 98.9.  She is no longer having any difficulty breathing after Duoneb and feels ready to go home.  She will follow-up with her PCP following today's visit as needed.  Will send prescriptions for Mucinex , albuterol  inhaler discussed over-the-counter medication usage for pain.  Work note provided.  The patient may return to the emergency department for any new, worsening, or concerning symptoms. Patient was given the opportunity to ask questions; all questions were answered. Emergency department return precautions were discussed with the patient.  Patient is in agreement to the treatment plan.  Patient is stable for discharge.   FINAL CLINICAL IMPRESSION(S) / ED DIAGNOSES   Final diagnoses:  Influenza A     Rx / DC Orders   ED Discharge Orders          Ordered    guaiFENesin  (MUCINEX ) 600 MG 12 hr tablet  2 times daily        01/06/25 1547    albuterol  (VENTOLIN  HFA) 108 (90 Base) MCG/ACT inhaler  Every 6 hours PRN        01/06/25 1547             Note:  This document was prepared using Dragon voice recognition software and may include unintentional dictation errors.     Sheron Ken[place], [place] 01/06/25 1636  "

## 2025-01-06 NOTE — Discharge Instructions (Addendum)
 You were diagnosed with the flu (influenza).  You will feel ill for as much as a few weeks.  Please take any prescribed medications as instructed, and you may use over-the-counter Tylenol and/or ibuprofen as needed according to label instructions (unless you have an allergy to either or have been told by your doctor not to take them).  Please make sure to drink plenty of fluids and refer to the included information about rehydration.  Follow up with your physician as instructed above, and return to the Emergency Department (ED) if you are unable to tolerate fluids due to vomiting, have worsening trouble breathing, become extremely tired or difficult to awaken, or if you develop any other symptoms that concern you.

## 2025-01-06 NOTE — ED Triage Notes (Signed)
 Pt to ED for flu sx, reports family has same. Sister checking in for same. Symptoms started yesterday Reports chest congestion, pain with coughing.
